# Patient Record
Sex: Female | Born: 1985 | Race: Black or African American | Hispanic: No | Marital: Married | Smoking: Never smoker
Health system: Southern US, Community
[De-identification: ages and names within clinical notes are randomized; demographics above are authoritative.]

## PROBLEM LIST (undated history)

## (undated) DIAGNOSIS — M5126 Other intervertebral disc displacement, lumbar region: Secondary | ICD-10-CM

## (undated) HISTORY — PX: ANKLE SURGERY: SHX546

---

## 2016-02-09 ENCOUNTER — Encounter (HOSPITAL_COMMUNITY): Payer: Self-pay | Admitting: Emergency Medicine

## 2016-02-09 ENCOUNTER — Emergency Department (HOSPITAL_COMMUNITY)
Admission: EM | Admit: 2016-02-09 | Discharge: 2016-02-09 | Disposition: A | Discharge status: Home / Self Care | Payer: No Typology Code available for payment source | Attending: Emergency Medicine | Admitting: Emergency Medicine

## 2016-02-09 DIAGNOSIS — Y939 Activity, unspecified: Secondary | ICD-10-CM | POA: Diagnosis not present

## 2016-02-09 DIAGNOSIS — Y999 Unspecified external cause status: Secondary | ICD-10-CM | POA: Diagnosis not present

## 2016-02-09 DIAGNOSIS — M545 Low back pain: Secondary | ICD-10-CM | POA: Diagnosis not present

## 2016-02-09 DIAGNOSIS — Y92410 Unspecified street and highway as the place of occurrence of the external cause: Secondary | ICD-10-CM | POA: Insufficient documentation

## 2016-02-09 MED ORDER — CYCLOBENZAPRINE HCL 10 MG PO TABS: 10.0000 mg | ORAL_TABLET | Freq: Three times a day (TID) | ORAL | 0 refills | Status: DC | PRN

## 2016-02-09 MED ORDER — IBUPROFEN 800 MG PO TABS: 800.0000 mg | ORAL_TABLET | Freq: Three times a day (TID) | ORAL | 0 refills | Status: AC
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
## 2016-02-09 NOTE — Discharge Instructions (Signed)
Take flexeril for muscle spasms. Take ibuprofen as needed for pain up to 3 times per day. May alternate with tylenol. If you develop loss of bowel or bladder, urinary retention, fevers, severe pain, numbness/tingling return to the ED. Follow up with ortho for imaging. Use heating pad for pain relief. Get plenty of rest.

## 2016-05-18 ENCOUNTER — Emergency Department (HOSPITAL_COMMUNITY): Payer: Self-pay

## 2016-05-18 ENCOUNTER — Encounter (HOSPITAL_COMMUNITY): Payer: Self-pay | Admitting: Emergency Medicine

## 2016-05-18 ENCOUNTER — Emergency Department (HOSPITAL_COMMUNITY)
Admission: EM | Admit: 2016-05-18 | Discharge: 2016-05-18 | Disposition: A | Discharge status: Home / Self Care | Payer: Self-pay | Attending: Emergency Medicine | Admitting: Emergency Medicine

## 2016-05-18 DIAGNOSIS — J069 Acute upper respiratory infection, unspecified: Secondary | ICD-10-CM | POA: Insufficient documentation

## 2016-05-18 MED ORDER — FLUTICASONE PROPIONATE 50 MCG/ACT NA SUSP: 1.0000 | Freq: Every day | NASAL | 0 refills | Status: AC

## 2016-05-18 NOTE — Discharge Instructions (Signed)
Please read and follow all provided instructions.  Your diagnoses today include:  1. Upper respiratory tract infection, unspecified type     You appear to have an upper respiratory infection (URI). An upper respiratory tract infection, or cold, is a viral infection of the air passages leading to the lungs. It should improve gradually after 5-7 days. You may have a lingering cough that lasts for 2- 4 weeks after the infection.  Tests performed today include: Vital signs. See below for your results today.   Medications prescribed:   Take any prescribed medications only as directed. Treatment for your infection is aimed at treating the symptoms. There are no medications, such as antibiotics, that will cure your infection.   Home care instructions:  Follow any educational materials contained in this packet.   Your illness is contagious and can be spread to others, especially during the first 3 or 4 days. It cannot be cured by antibiotics or other medicines. Take basic precautions such as washing your hands often, covering your mouth when you cough or sneeze, and avoiding public places where you could spread your illness to others.   Please continue drinking plenty of fluids.  Use over-the-counter medicines as needed as directed on packaging for symptom relief.  You may also use ibuprofen or tylenol as directed on packaging for pain or fever.  Do not take multiple medicines containing Tylenol or acetaminophen to avoid taking too much of this medication.  Follow-up instructions: Please follow-up with your primary care provider in the next 3 days for further evaluation of your symptoms if you are not feeling better.   Return instructions:  Please return to the Emergency Department if you experience worsening symptoms.  RETURN IMMEDIATELY IF you develop shortness of breath, confusion or altered mental status, a new rash, become dizzy, faint, or poorly responsive, or are unable to be cared for at  home. Please return if you have persistent vomiting and cannot keep down fluids or develop a fever that is not controlled by tylenol or motrin.   Please return if you have any other emergent concerns.  Additional Information:  Your vital signs today were: BP 117/88 (BP Location: Left Arm)    Pulse 74    Temp 98.1 F (36.7 C) (Oral)    Resp 16    Ht 5\' 6"  (1.676 m)    Wt 93.9 kg    LMP 05/09/2016 (Approximate)    SpO2 100%    BMI 33.41 kg/m  If your blood pressure (BP) was elevated above 135/85 this visit, please have this repeated by your doctor within one month. --------------

## 2016-05-18 NOTE — ED Triage Notes (Signed)
Pt. reports persistent productive cough with chest congestion / nasal congestion and runny nose for 3 days , orthopnea when lying flat , denies fever or chills , pain at right axilla with deep inspiration .

## 2016-05-18 NOTE — ED Provider Notes (Signed)
[facility]PT Provider Note   CSN: [id number] Arrival date & time: 05/18/16  [id number]  History   Chief Complaint Chief Complaint  Patient presents with  . Cough    Chest Congestion   . Nasal Congestion    HPI Carolyn Farrell is a 30 y.o. female.  HPI  30 y.o. female presents to the Emergency Department today complaining of persistent cough, chest congestion x 3 days. Notes rhinorrhea as well with sinus congestion. No fever/chills. Used OTC mucinex once with some relief. Notes No CP/ABD pain. Mild SOB. No N/V/D. No sick contacts. No pain currently. No recent travel. No other symptoms noted   History reviewed. No pertinent past medical history.  There are no active problems to display for this patient.   Past Surgical History:  Procedure Laterality Date  . ANKLE SURGERY      OB History    No data available       Home Medications    Prior to Admission medications   Medication Sig Start Date End Date Taking? Authorizing Provider  cyclobenzaprine (FLEXERIL) 10 MG tablet Take 1 tablet (10 mg total) by mouth 3 (three) times daily as needed for muscle spasms. 02/09/16   Rise MuKenneth T Leaphart, PA-C  ibuprofen (ADVIL,MOTRIN) 800 MG tablet Take 1 tablet (800 mg total) by mouth 3 (three) times daily. 02/09/16   Rise MuKenneth T Leaphart, PA-C    Family History No family history on file.  Social History Social History  Substance Use Topics  . Smoking status: Never Smoker  . Smokeless tobacco: Never Used  . Alcohol use Yes     Comment: occ     Allergies   Patient has no known allergies.   Review of Systems Review of Systems  Constitutional: Negative for fever.  HENT: Positive for congestion, rhinorrhea and sinus pressure.   Respiratory: Positive for cough and shortness of breath.   Cardiovascular: Negative for chest pain.  Gastrointestinal: Negative for diarrhea, nausea and vomiting.   Physical Exam Updated Vital Signs BP 117/88 (BP Location: Left Arm)   Pulse 74    Temp 98.1 F (36.7 C) (Oral)   Resp 16   Ht 5\' 6"  (1.676 m)   Wt 93.9 kg   LMP 05/09/2016 (Approximate)   SpO2 100%   BMI 33.41 kg/m   Physical Exam  Constitutional: She is oriented to person, place, and time. She appears well-developed and well-nourished. No distress.  HENT:  Head: Normocephalic and atraumatic.  Right Ear: Tympanic membrane, external ear and ear canal normal.  Left Ear: Tympanic membrane, external ear and ear canal normal.  Nose: Nose normal.  Mouth/Throat: Uvula is midline, oropharynx is clear and moist and mucous membranes are normal. No trismus in the jaw. No oropharyngeal exudate, posterior oropharyngeal erythema or tonsillar abscesses.  Eyes: EOM are normal. Pupils are equal, round, and reactive to light.  Neck: Normal range of motion. Neck supple. No tracheal deviation present.  Cardiovascular: Normal rate, regular rhythm, S1 normal, S2 normal, normal heart sounds, intact distal pulses and normal pulses.   Pulmonary/Chest: Effort normal and breath sounds normal. No respiratory distress. She has no decreased breath sounds. She has no wheezes. She has no rhonchi. She has no rales.  Abdominal: Normal appearance and bowel sounds are normal. There is no tenderness.  Musculoskeletal: Normal range of motion.  Neurological: She is alert and oriented to person, place, and time.  Skin: Skin is warm and dry.  Psychiatric: She has a normal mood and affect. Her speech is  normal and behavior is normal. Thought content normal.     ED Treatments / Results  Labs (all labs ordered are listed, but only abnormal results are displayed) Labs Reviewed - No data to display  EKG  EKG Interpretation None       Radiology Dg Chest 2 View  Result Date: 05/18/2016 CLINICAL DATA:  Productive cough with hemoptysis. Shortness of breath. EXAM: CHEST  2 VIEW COMPARISON:  None. FINDINGS: Lungs are clear. Heart size and pulmonary vascularity are normal. No adenopathy. No bone  lesions. IMPRESSION: No edema or consolidation. Electronically Signed   By: Bretta BangWilliam  Woodruff III M.D.   On: 05/18/2016 08:39    Procedures Procedures (including critical care time)  Medications Ordered in ED Medications - No data to display   Initial Impression / Assessment and Plan / ED Course  I have reviewed the triage vital signs and the nursing notes.  Pertinent labs & imaging results that were available during my care of the patient were reviewed by me and considered in my medical decision making (see chart for details).  Clinical Course    Final Clinical Impressions(s) / ED Diagnoses   {I have reviewed and evaluated the relevant imaging studies.  {I have reviewed the relevant previous healthcare records.  {I obtained HPI from historian.   ED Course:  Assessment: Pt is a 30yF presents with URI symptoms x 3 days. No fever. No N/V/D. No CP/ABD pain. Mild SOB. No recent travel. No recent surgeries. On exam, pt in NAD. VSS. Afebrile. Lungs CTA, Heart RRR. Abdomen nontender/soft. Pt CXR negative for acute infiltrate. Patients symptoms are consistent with URI, likely viral etiology. Discussed that antibiotics are not indicated for viral infections. PERC negative. Pt will be discharged with symptomatic treatment.  Verbalizes understanding and is agreeable with plan. Pt is hemodynamically stable & in NAD prior to dc  Disposition/Plan:  DC Home Additional Verbal discharge instructions given and discussed with patient.  Pt Instructed to f/u with PCP in the next week for evaluation and treatment of symptoms. Return precautions given Pt acknowledges and agrees with plan  Supervising Physician Alvira MondayErin Schlossman, MD  Final diagnoses:  Upper respiratory tract infection, unspecified type    New Prescriptions New Prescriptions   FLUTICASONE (FLONASE) 50 MCG/ACT NASAL SPRAY    Place 1 spray into both nostrils daily.     [name] Deadrian Toya, PA-C 05/18/16 [id number]    Alvira MondayErin Schlossman, MD 05/19/16  [phone]

## 2016-10-02 ENCOUNTER — Encounter (HOSPITAL_COMMUNITY): Payer: Self-pay | Admitting: Emergency Medicine

## 2016-10-02 ENCOUNTER — Emergency Department (HOSPITAL_COMMUNITY)
Admission: EM | Admit: 2016-10-02 | Discharge: 2016-10-02 | Disposition: A | Discharge status: Home / Self Care | Payer: Self-pay | Attending: Emergency Medicine | Admitting: Emergency Medicine

## 2016-10-02 DIAGNOSIS — Z79899 Other long term (current) drug therapy: Secondary | ICD-10-CM | POA: Insufficient documentation

## 2016-10-02 DIAGNOSIS — M5441 Lumbago with sciatica, right side: Secondary | ICD-10-CM

## 2016-10-02 HISTORY — DX: Other intervertebral disc displacement, lumbar region: M51.26

## 2016-10-02 MED ORDER — IBUPROFEN 200 MG PO TABS
600.0000 mg | ORAL_TABLET | Freq: Once | ORAL | Status: AC
  Administered 2016-10-02: 600 mg via ORAL
  Filled 2016-10-02: qty 3

## 2016-10-02 MED ORDER — PREDNISONE 20 MG PO TABS
60.0000 mg | ORAL_TABLET | Freq: Once | ORAL | Status: AC
  Administered 2016-10-02: 60 mg via ORAL
  Filled 2016-10-02: qty 3

## 2016-10-02 MED ORDER — PREDNISONE 10 MG (21) PO TBPK: ORAL_TABLET | Freq: Every day | ORAL | 0 refills | Status: DC

## 2016-10-02 MED ORDER — METHOCARBAMOL 500 MG PO TABS
750.0000 mg | ORAL_TABLET | Freq: Once | ORAL | Status: AC
  Administered 2016-10-02: 750 mg via ORAL
  Filled 2016-10-02: qty 2

## 2016-10-02 MED ORDER — TRAMADOL HCL 50 MG PO TABS: 50.0000 mg | ORAL_TABLET | Freq: Four times a day (QID) | ORAL | 0 refills | Status: AC | PRN

## 2016-10-02 MED ORDER — ACETAMINOPHEN 500 MG PO TABS
1000.0000 mg | ORAL_TABLET | Freq: Once | ORAL | Status: AC
  Administered 2016-10-02: 1000 mg via ORAL
  Filled 2016-10-02: qty 2

## 2016-10-02 MED ORDER — METHOCARBAMOL 500 MG PO TABS: 500.0000 mg | ORAL_TABLET | Freq: Three times a day (TID) | ORAL | 0 refills | Status: AC

## 2016-10-02 NOTE — ED Notes (Signed)
Bed: WTR5 Expected date:  Expected time:  Means of arrival:  Comments: 

## 2016-10-02 NOTE — ED Triage Notes (Signed)
Patient states that yesterday on her drive back from [place] she started having right lower back pain that runs down right leg.  Patient states that she has a herniated disc and sees ortho for but they are closed today.

## 2016-10-02 NOTE — ED Provider Notes (Signed)
[facility]-EMERGENCY DEPT Provider Note   CSN: [id number] Arrival date & time: 10/02/16  4098  By signing my name below, I, Modena Jansky, attest that this documentation has been prepared under the direction and in the presence of non-physician practitioner, Liberty Handy, PA-C. Electronically Signed: Modena Jansky, Scribe. 10/02/2016. 10:27 AM.  History   Chief Complaint Chief Complaint  Patient presents with  . Back Pain   The history is provided by the patient. No language interpreter was used.   HPI Comments: Carolyn Farrell is a 31 y.o. female with a PMHx of lumbar herniated disc who presents to the Emergency Department complaining of constant moderate right lower back pain that started yesterday. She had an MRI done last month ago revealing a bulging lumbar disc (L5). She states her pain began when she was driving home from [place]. She has a prior hx of pain, but not as severe as today. She tried Flexeril yesterday without relief. Her pain is exacerbated by sitting and coughing. She describes the pain as a sharp sensation radiating down her RLE. She reports associated tingling to right posterior thigh. Denies any hx of IV drug use, fever, nausea, vomiting, diarrhea, dysuria, hematuria, difficulty urinating, bowel/bladder incontinence, saddle paresthesias, BLE weakness, or other complaints at this time.  Past Medical History:  Diagnosis Date  . Lumbar herniated disc     There are no active problems to display for this patient.   Past Surgical History:  Procedure Laterality Date  . ANKLE SURGERY      OB History    No data available       Home Medications    Prior to Admission medications   Medication Sig Start Date End Date Taking? Authorizing Provider  cyclobenzaprine (FLEXERIL) 10 MG tablet Take 1 tablet (10 mg total) by mouth 3 (three) times daily as needed for muscle spasms. 02/09/16   Rise Mu, PA-C  fluticasone (FLONASE) 50 MCG/ACT nasal spray Place 1 spray  into both nostrils daily. 05/18/16   Audry Pili, PA-C  ibuprofen (ADVIL,MOTRIN) 800 MG tablet Take 1 tablet (800 mg total) by mouth 3 (three) times daily. 02/09/16   Rise Mu, PA-C  methocarbamol (ROBAXIN) 500 MG tablet Take 1 tablet (500 mg total) by mouth 3 (three) times daily. 10/02/16 10/07/16  Liberty Handy, PA-C  predniSONE (STERAPRED UNI-PAK 21 TAB) 10 MG (21) TBPK tablet Take by mouth daily. Take 6 tabs by mouth daily  for 2 days, then 5 tabs for 2 days, then 4 tabs for 2 days, then 3 tabs for 2 days, 2 tabs for 2 days, then 1 tab by mouth daily for 2 days 10/02/16   Liberty Handy, PA-C  traMADol (ULTRAM) 50 MG tablet Take 1 tablet (50 mg total) by mouth every 6 (six) hours as needed. 10/02/16   Liberty Handy, PA-C    Family History No family history on file.  Social History Social History  Substance Use Topics  . Smoking status: Never Smoker  . Smokeless tobacco: Never Used  . Alcohol use Yes     Comment: occ     Allergies   Patient has no known allergies.   Review of Systems Review of Systems  Constitutional: Negative for fever.  Gastrointestinal: Negative for abdominal pain, diarrhea, nausea and vomiting.  Genitourinary: Negative for difficulty urinating, dysuria and hematuria.  Musculoskeletal: Positive for back pain.  Neurological: Negative for weakness.     Physical Exam Updated Vital Signs BP 130/84 (BP Location: Left Arm)  Pulse 93   Temp 98.3 F (36.8 C) (Oral)   Resp 18   Ht  (1.676 m)   Wt 195 lb (88.5 kg)   LMP 09/25/2016   SpO2 99%   BMI 31.47 kg/m   Physical Exam  Constitutional: She is oriented to person, place, and time. She appears well-developed and well-nourished. She is cooperative. No distress.  HENT:  Head: Normocephalic and atraumatic.  Nose: Nose normal.  Eyes: Conjunctivae and EOM are normal. No scleral icterus.  Cardiovascular: Normal rate, regular rhythm, normal heart sounds and intact distal pulses.   No  murmur heard. Pulmonary/Chest: Effort normal and breath sounds normal. She has no wheezes.  Musculoskeletal: Normal range of motion. She exhibits tenderness. She exhibits no deformity.  +Tenderness to right sciatic notch and SI joint.  +Pain is worsened with right hip flexion, internal rotation, and external rotation.  +Positive straight leg raise on the right.  +Slow, antalgic gait favoring right side Full active ROM of CTL spine including flexion, extension, lateral bend and rotation. +Pain worse with lumbar spine flexion  No midline CTL spine tenderness.  Full passive hip, knee and ankle ROM bilaterally.   Neurological: She is alert and oriented to person, place, and time.  5/5 strength with hip flexion and extension, bilaterally.  5/5 strength with knee flexion and extension, bilaterally.  5/5 strength with ankle dorsiflexion and plantar flexion, bilaterally.  Sensation to light touch intact in the distribution of the obturator nerve, lateral cutaneous nerve, femoral nerve, common fibular nerve.  2/4 knee DTR bilaterally.    Foot: sensation to light touch intact in the distribution of the saphenous nerve, medial plantar nerve, lateral plantar nerve, bilaterally.   Skin: Skin is warm and dry. Capillary refill takes less than 2 seconds.  Psychiatric: She has a normal mood and affect. Her behavior is normal. Judgment and thought content normal.  Nursing note and vitals reviewed.    ED Treatments / Results  DIAGNOSTIC STUDIES: Oxygen Saturation is 99% on RA, normal by my interpretation.    COORDINATION OF CARE: 10:31 AM- Pt advised of plan for treatment and pt agrees.  Labs (all labs ordered are listed, but only abnormal results are displayed) Labs Reviewed - No data to display  EKG  EKG Interpretation None       Radiology No results found.  Procedures Procedures (including critical care time)  Medications Ordered in ED Medications  ibuprofen (ADVIL,MOTRIN) tablet  600 mg (600 mg Oral Given 10/02/16 1113)  acetaminophen (TYLENOL) tablet 1,000 mg (1,000 mg Oral Given 10/02/16 1113)  methocarbamol (ROBAXIN) tablet 750 mg (750 mg Oral Given 10/02/16 1116)  predniSONE (DELTASONE) tablet 60 mg (60 mg Oral Given 10/02/16 1114)     Initial Impression / Assessment and Plan / ED Course  I have reviewed the triage vital signs and the nursing notes.  Pertinent labs & imaging results that were available during my care of the patient were reviewed by me and considered in my medical decision making (see chart for details).     Patient is a 31 y.o. female with pertinent pmh of L5 disc protrusion diagnosed by MRI done last month (followed by [facility]) presents with right LBP with radiation to posterior right thigh that started yesterday after car ride from [place].  On exam VS are within normal limits.  Patient can walk but states it aggravates back pain.  There is tenderness at R SI joint and R sciatic notch.  LBP radiating to posterior right  thigh is consistently reproducible with +SLR.  No groin numbness.  Normal lower extremity neurological exam.    Initial differential diagnoses include muscular strain or spasms or exacerbation or known L5 disc protrusion.  Low suspicion for vertebral compression fx, pyelonephritis, nephrolithiasis, epidural abscess, AAA, dissection, cauda equina.  No red flag symptoms of back pain including: fecal incontinence, urinary retention or overflow incontinence, night sweats, waking from sleep with back pain, unexplained fevers or weight loss, h/o cancer, IVDU, recent trauma.  ED labs and imaging not indicated today as abdominal and MSK exam reassuring and no red flag symptoms present. I do not suspect bony or intraabdominal/pelvic emergency at this time. Will treat radiculopathy with steroids, short course of narcotic pain medications, robaxin, NSAIDs, rest, ice, and ortho f/u for further outpatient interventions. ED return precautions  discussed with pt who verbalized understanding and was agreeable to dispo plan. Patient has follow up appointment with orthopedist already in 4 days.   Final Clinical Impressions(s) / ED Diagnoses   Final diagnoses:  Acute right-sided low back pain with right-sided sciatica    New Prescriptions Discharge Medication List as of 10/02/2016 10:57 AM    START taking these medications   Details  methocarbamol (ROBAXIN) 500 MG tablet Take 1 tablet (500 mg total) by mouth 3 (three) times daily., Starting Sun 10/02/2016, Until Fri 10/07/2016, Print    predniSONE (STERAPRED UNI-PAK 21 TAB) 10 MG (21) TBPK tablet Take by mouth daily. Take 6 tabs by mouth daily  for 2 days, then 5 tabs for 2 days, then 4 tabs for 2 days, then 3 tabs for 2 days, 2 tabs for 2 days, then 1 tab by mouth daily for 2 days, Starting Sun 10/02/2016, Print    traMADol (ULTRAM) 50 MG tablet Take 1 tablet (50 mg total) by mouth every 6 (six) hours as needed., Starting Sun 10/02/2016, Print       I personally performed the services described in this documentation, which was scribed in my presence. The recorded information has been reviewed and is accurate.     Liberty Handy, PA-C 10/02/16 1258    Tilden Fossa, MD 10/03/16 [phone]

## 2016-10-02 NOTE — Discharge Instructions (Signed)
Your symptoms today are most likely due to a flare of your disc protrusion of your lumbar spine affecting or inflaming your sciatic nerve.  Please take the following medications as prescribed for the next 3-5 days or until you see your orthopedist: Prednisone for inflammation Robaxin for muscle spasms  Tramadol for severe pain  Ibuprofen 600 mg + Tylenol 1000 mg every 8 hours  Follow up with your orthopedist appointment in 4 days for a re-evaluation.  Your orthopedist may suggest physical therapy or other additional treatment modalities  Avoid any exertional activities that worsen your pain.  Return to the emergency department if you notice lower extremity weakness, groin numbness, bowel or bladder incontinence, fevers or if your symptoms worsen.

## 2017-06-06 ENCOUNTER — Telehealth (HOSPITAL_COMMUNITY): Payer: Self-pay | Admitting: Lactation Services

## 2018-03-07 ENCOUNTER — Emergency Department (HOSPITAL_COMMUNITY)
Admission: EM | Admit: 2018-03-07 | Discharge: 2018-03-07 | Disposition: A | Discharge status: Home / Self Care | Payer: Self-pay | Attending: Emergency Medicine | Admitting: Emergency Medicine

## 2018-03-07 ENCOUNTER — Encounter (HOSPITAL_COMMUNITY): Payer: Self-pay | Admitting: *Deleted

## 2018-03-07 DIAGNOSIS — M545 Low back pain, unspecified: Secondary | ICD-10-CM

## 2018-03-07 DIAGNOSIS — Z79899 Other long term (current) drug therapy: Secondary | ICD-10-CM | POA: Insufficient documentation

## 2018-03-07 DIAGNOSIS — G8929 Other chronic pain: Secondary | ICD-10-CM | POA: Insufficient documentation

## 2018-03-07 MED ORDER — METHOCARBAMOL 1000 MG/10ML IJ SOLN: 500.0000 mg | Freq: Once | INTRAMUSCULAR | Status: DC

## 2018-03-07 MED ORDER — CYCLOBENZAPRINE HCL 10 MG PO TABS: 10.0000 mg | ORAL_TABLET | Freq: Two times a day (BID) | ORAL | 0 refills | Status: DC | PRN

## 2018-03-07 MED ORDER — FENTANYL CITRATE (PF) 100 MCG/2ML IJ SOLN
50.0000 ug | Freq: Once | INTRAMUSCULAR | Status: AC
  Administered 2018-03-07: 50 ug via INTRAMUSCULAR
  Filled 2018-03-07: qty 2

## 2018-03-07 MED ORDER — PREDNISONE 20 MG PO TABS
60.0000 mg | ORAL_TABLET | Freq: Once | ORAL | Status: AC
  Administered 2018-03-07: 60 mg via ORAL
  Filled 2018-03-07: qty 3

## 2018-03-07 MED ORDER — NITROGLYCERIN IN D5W 200-5 MCG/ML-% IV SOLN: 0.0000 ug/min | INTRAVENOUS | Status: DC

## 2018-03-07 MED ORDER — ONDANSETRON 4 MG PO TBDP
4.0000 mg | ORAL_TABLET | Freq: Once | ORAL | Status: AC
  Administered 2018-03-07: 4 mg via ORAL
  Filled 2018-03-07: qty 1

## 2018-03-07 MED ORDER — OXYCODONE-ACETAMINOPHEN 5-325 MG PO TABS
1.0000 | ORAL_TABLET | ORAL | Status: DC | PRN
  Administered 2018-03-07: 1 via ORAL
  Filled 2018-03-07: qty 1

## 2018-03-07 MED ORDER — HYDROCODONE-ACETAMINOPHEN 5-325 MG PO TABS: 1.0000 | ORAL_TABLET | Freq: Four times a day (QID) | ORAL | 0 refills | Status: AC | PRN

## 2018-03-07 MED ORDER — METHOCARBAMOL 500 MG PO TABS
500.0000 mg | ORAL_TABLET | Freq: Once | ORAL | Status: AC
  Administered 2018-03-07: 500 mg via ORAL
  Filled 2018-03-07: qty 1

## 2018-03-07 NOTE — ED Triage Notes (Signed)
Pt in via EMS to triage, was taking a shower and developed a sharp pain to her right lower back that radiates down her right leg, pain is intermittent and worse with movement, history of herniated disk

## 2018-03-07 NOTE — ED Provider Notes (Signed)
Patient placed in Quick Look pathway, seen and evaluated   Chief Complaint: Low back pain  HPI:  Showering earlier today and got sudden onset lower back pain radiating down her right leg.  No fall or injury.  She reports pain is 10/10 in severity and feels like a spasm.  Worsened with palpation or with right leg movement.  No medications prior to arrival.  ROS: No numbness or weakness in her legs.  No loss of bowel or bladder control.  Physical Exam:   Gen: No distress  Neuro: Awake and Alert  Skin: Warm    Focused Exam: Exquisitely tender to palpation of the right paraspinal muscles of the lumbar spine.  No midline T-spine or L-spine tenderness.  Sensation to light touch intact in bilateral lower extremities.   Initiation of care has begun. The patient has been counseled on the process, plan, and necessity for staying for the completion/evaluation, and the remainder of the medical screening examination    Farrell MarseillesShrosbree, Carolyn Morace J, PA-C 03/07/18 1908    Mesner, Barbara CowerJason, MD 03/08/18 [phone]

## 2018-03-07 NOTE — ED Notes (Signed)
Pt vomited immediately after receiving percocet

## 2018-03-07 NOTE — Discharge Instructions (Addendum)
Please read instructions below.  You can take hydrocodone every 6 hours for severe pain. You can take 600mg  of ibuprofen every 6 hours as needed for pain.  Be aware this medication can make you drowsy; do not take while driving or drinking alcohol.   Apply ice to your back for 20 minutes at a time.  You can also apply heat if this provides more relief.   You can take Flexeril/cyclobenzaprine every 12 hours as needed for muscle spasm.  Be aware this medication can make you drowsy; do not take while driving or drinking alcohol.   Follow-up with your primary care provider symptoms persist.   Return to ER if new numbness or tingling in your arms or legs, inability to urinate, inability to hold your bowels, or weakness in your extremities.

## 2018-03-07 NOTE — ED Provider Notes (Signed)
[facility] EMERGENCY DEPARTMENT Provider Note   CSN: [id number] Arrival date & time: 03/07/18  1832     History   Chief Complaint Chief Complaint  Patient presents with  . Back Pain    HPI Carolyn Farrell is a 32 y.o. female with past medical history of herniated lumbar disc, presenting to the ED with acute onset of right lower back pain.  She states she was in the shower and must have turned wrong and had instant pain to the right lower back radiating down into her right leg.  Pain is similar to her pain associated with herniated disc, however states this is the most pain she is experienced.  Denies particular injury or fall.  No medications tried prior to arrival.  Pain began around 5 PM this evening.  She is followed by [facility] for her back pain.  Denies weakness to extremities, bowel or bladder incontinence, saddle paresthesia, fever, abdominal pain, history of cancer or IV drug use.  She was given Percocet in triage though states she vomited up secondary to pain.  States she is not nauseous.  The history is provided by the patient.    Past Medical History:  Diagnosis Date  . Lumbar herniated disc     There are no active problems to display for this patient.   Past Surgical History:  Procedure Laterality Date  . ANKLE SURGERY       OB History   None      Home Medications    Prior to Admission medications   Medication Sig Start Date End Date Taking? Authorizing Provider  cyclobenzaprine (FLEXERIL) 10 MG tablet Take 1 tablet (10 mg total) by mouth 2 (two) times daily as needed for muscle spasms. 03/07/18   Robinson, [place] N, PA-C  fluticasone (FLONASE) 50 MCG/ACT nasal spray Place 1 spray into both nostrils daily. 05/18/16   Audry Pili, PA-C  HYDROcodone-acetaminophen (NORCO/VICODIN) 5-325 MG tablet Take 1 tablet by mouth every 6 (six) hours as needed for severe pain. 03/07/18   Robinson, [place] N, PA-C  ibuprofen (ADVIL,MOTRIN) 800 MG  tablet Take 1 tablet (800 mg total) by mouth 3 (three) times daily. 02/09/16   Rise Mu, PA-C  predniSONE (STERAPRED UNI-PAK 21 TAB) 10 MG (21) TBPK tablet Take by mouth daily. Take 6 tabs by mouth daily  for 2 days, then 5 tabs for 2 days, then 4 tabs for 2 days, then 3 tabs for 2 days, 2 tabs for 2 days, then 1 tab by mouth daily for 2 days 10/02/16   Liberty Handy, PA-C  traMADol (ULTRAM) 50 MG tablet Take 1 tablet (50 mg total) by mouth every 6 (six) hours as needed. 10/02/16   Liberty Handy, PA-C    Family History History reviewed. No pertinent family history.  Social History Social History   Tobacco Use  . Smoking status: Never Smoker  . Smokeless tobacco: Never Used  Substance Use Topics  . Alcohol use: Yes    Comment: occ  . Drug use: No     Allergies   Patient has no known allergies.   Review of Systems Review of Systems  Constitutional: Negative for fever.  Gastrointestinal: Negative for abdominal pain.       No bowel incontinence  Genitourinary: Negative for difficulty urinating.  Musculoskeletal: Positive for back pain.  Neurological: Negative for weakness and numbness.  All other systems reviewed and are negative.    Physical Exam Updated Vital Signs BP 121/82 (BP  Location: Left Arm)   Pulse 87   Temp 98.7 F (37.1 C) (Oral)   Resp 20   Ht 5\' 6"  (1.676 m)   Wt 88.5 kg   SpO2 98%   BMI 31.47 kg/m   Physical Exam  Constitutional: She appears well-developed and well-nourished.  HENT:  Head: Normocephalic and atraumatic.  Eyes: Conjunctivae are normal.  Cardiovascular: Normal rate, regular rhythm and intact distal pulses.  Pulmonary/Chest: Effort normal and breath sounds normal.  Abdominal: Soft. Bowel sounds are normal. She exhibits no distension. There is no tenderness.  Musculoskeletal:  No midline spinal tenderness, no bony step-offs or gross deformities.  There is right sided paraspinal tenderness to the lumbar spine with  extension into the gluteal region.  Neurological:  Motor:  Normal tone. 5/5 in lower extremities bilaterally including strong and equal dorsiflexion/plantar flexion Sensory: Pinprick and light touch normal in BLE extremities.  Deep Tendon Reflexes: 2+ and symmetric in the b/l patella Gait: Unable to assess gait secondary to pain. CV: distal pulses palpable throughout    Psychiatric: She has a normal mood and affect. Her behavior is normal.  Nursing note and vitals reviewed.    ED Treatments / Results  Labs (all labs ordered are listed, but only abnormal results are displayed) Labs Reviewed - No data to display  EKG None  Radiology No results found.  Procedures Procedures (including critical care time)  Medications Ordered in ED Medications  oxyCODONE-acetaminophen (PERCOCET/ROXICET) 5-325 MG per tablet 1 tablet (1 tablet Oral Given 03/07/18 1853)  predniSONE (DELTASONE) tablet 60 mg (60 mg Oral Given 03/07/18 2134)  ondansetron (ZOFRAN-ODT) disintegrating tablet 4 mg (4 mg Oral Given 03/07/18 2058)  fentaNYL (SUBLIMAZE) injection 50 mcg (50 mcg Intramuscular Given 03/07/18 2100)  methocarbamol (ROBAXIN) tablet 500 mg (500 mg Oral Given 03/07/18 2134)     Initial Impression / Assessment and Plan / ED Course  I have reviewed the triage vital signs and the nursing notes.  Pertinent labs & imaging results that were available during my care of the patient were reviewed by me and considered in my medical decision making (see chart for details).     Patient with back pain, history of herniated disc.  No recent injury.  No neurological deficits and normal neuro exam.   Patient can walk but states is painful.  No loss of bowel or bladder control.  No concern for cauda equina.  No fever, night sweats, weight loss, h/o cancer, IVDU.  Pain treated in the ED with improvement.  Discussed symptomatic management, and follow-up with her orthopedic specialist.  Safe for discharge.  [place] Controlled Substance reporting System queried  Discussed results, findings, treatment and follow up. Patient advised of return precautions. Patient verbalized understanding and agreed with plan.  Final Clinical Impressions(s) / ED Diagnoses   Final diagnoses:  Acute exacerbation of chronic low back pain    ED Discharge Orders         Ordered    HYDROcodone-acetaminophen (NORCO/VICODIN) 5-325 MG tablet  Every 6 hours PRN     03/07/18 2228    cyclobenzaprine (FLEXERIL) 10 MG tablet  2 times daily PRN     03/07/18 2228           Robinson, [place] N, PA-C 03/07/18 2230    Benjiman CorePickering, Nathan, MD 03/07/18 2230

## 2018-03-07 NOTE — ED Notes (Signed)
Pts family reporting that pt was given pain medications out front and immediately vomited after.  Family states they said they were going to order something else but it wasn't administered.

## 2018-03-07 NOTE — ED Notes (Signed)
Discharge instructions and prescriptions discussed with Pt. Pt verbalized understanding. Pt stable and leaving via WC. 

## 2019-02-11 ENCOUNTER — Emergency Department (HOSPITAL_COMMUNITY): Payer: Self-pay

## 2019-02-11 ENCOUNTER — Emergency Department (HOSPITAL_COMMUNITY)
Admission: EM | Admit: 2019-02-11 | Discharge: 2019-02-11 | Disposition: A | Discharge status: Home / Self Care | Payer: Self-pay | Attending: Emergency Medicine | Admitting: Emergency Medicine

## 2019-02-11 ENCOUNTER — Other Ambulatory Visit: Payer: Self-pay

## 2019-02-11 DIAGNOSIS — Z20828 Contact with and (suspected) exposure to other viral communicable diseases: Secondary | ICD-10-CM | POA: Insufficient documentation

## 2019-02-11 DIAGNOSIS — R05 Cough: Secondary | ICD-10-CM | POA: Insufficient documentation

## 2019-02-11 DIAGNOSIS — M940 Chondrocostal junction syndrome [Tietze]: Secondary | ICD-10-CM | POA: Insufficient documentation

## 2019-02-11 DIAGNOSIS — R059 Cough, unspecified: Secondary | ICD-10-CM

## 2019-02-11 DIAGNOSIS — Z79899 Other long term (current) drug therapy: Secondary | ICD-10-CM | POA: Insufficient documentation

## 2019-02-11 LAB — I-STAT BETA HCG BLOOD, ED (MC, WL, AP ONLY): I-stat hCG, quantitative: <5 m[IU]/mL (ref <5)

## 2019-02-11 LAB — CBC
HCT: 41.8 % (ref 36.0–46.0)
Hemoglobin: 13.6 g/dL (ref 12.0–15.0)
MCH: 29.4 pg (ref 26.0–34.0)
MCHC: 32.5 g/dL (ref 30.0–36.0)
MCV: 90.3 fL (ref 80.0–100.0)
Platelets: 308 10*3/uL (ref 150–400)
RBC: 4.63 MIL/uL (ref 3.87–5.11)
RDW: 12.7 % (ref 11.5–15.5)
WBC: 6.2 10*3/uL (ref 4.0–10.5)
nRBC: 0 % (ref 0.0–0.2)

## 2019-02-11 LAB — BASIC METABOLIC PANEL
Anion gap: 10 (ref 5–15)
BUN: 14 mg/dL (ref 6–20)
CO2: 22 mmol/L (ref 22–32)
Calcium: 9.4 mg/dL (ref 8.9–10.3)
Chloride: 107 mmol/L (ref 98–111)
Creatinine, Ser: 0.92 mg/dL (ref 0.44–1.00)
GFR calc Af Amer: >60 mL/min (ref >60)
GFR calc non Af Amer: >60 mL/min (ref >60)
Glucose, Bld: 99 mg/dL (ref 70–99)
Potassium: 3.8 mmol/L (ref 3.5–5.1)
Sodium: 139 mmol/L (ref 135–145)

## 2019-02-11 LAB — TROPONIN I (HIGH SENSITIVITY): Troponin I (High Sensitivity): <2 ng/L (ref <18)

## 2019-02-11 LAB — SARS CORONAVIRUS 2 BY RT PCR (HOSPITAL ORDER, PERFORMED IN ~~LOC~~ HOSPITAL LAB): SARS Coronavirus 2: NEGATIVE

## 2019-02-11 MED ORDER — IBUPROFEN 200 MG PO TABS
600.0000 mg | ORAL_TABLET | Freq: Once | ORAL | Status: AC
  Administered 2019-02-11: 13:00:00 600 mg via ORAL
  Filled 2019-02-11: qty 3

## 2019-02-11 NOTE — ED Triage Notes (Signed)
Pt c/o chest pain and shob and chills since Saturday. Reports Saturday had cough but has subsided.

## 2019-02-11 NOTE — Discharge Instructions (Signed)
Please return for any problem.  Follow-up with your regular care provider as instructed. °

## 2019-02-11 NOTE — ED Provider Notes (Signed)
[facility]-EMERGENCY DEPT Provider Note   CSN: [id number] Arrival date & time: 02/11/19  1139     History   Chief Complaint Chief Complaint  Patient presents with  . Shortness of Breath  . Chest Pain  . Chills    HPI Carolyn Farrell is a 33 y.o. female.     33 year old female with prior medical history as detailed below presents for evaluation of left-sided chest discomfort.  Patient reports sharp anterior left-sided chest discomfort.  This is been ongoing for the last 2 to 3 days.  She reports that this is associated with a mild intermittent cough.  She also reports some mild chills at home.  No documented fever at home.    She reports that her mother may have tested positive for COVID.   No history of prior DVT/PE. No history of CAD.    The history is provided by the patient and medical records.  Chest Pain Pain location:  L chest Pain quality: sharp   Pain radiates to:  Upper back Pain severity:  Mild Onset quality:  Gradual Duration:  3 days Timing:  Constant Progression:  Waxing and waning Chronicity:  New Relieved by:  Nothing Worsened by:  Nothing   Past Medical History:  Diagnosis Date  . Lumbar herniated disc     There are no active problems to display for this patient.   Past Surgical History:  Procedure Laterality Date  . ANKLE SURGERY       OB History   No obstetric history on file.      Home Medications    Prior to Admission medications   Medication Sig Start Date End Date Taking? Authorizing Provider  cyclobenzaprine (FLEXERIL) 10 MG tablet Take 1 tablet (10 mg total) by mouth 2 (two) times daily as needed for muscle spasms. 03/07/18   Robinson, [place] N, PA-C  fluticasone (FLONASE) 50 MCG/ACT nasal spray Place 1 spray into both nostrils daily. 05/18/16   Audry PiliMohr, Tyler, PA-C  HYDROcodone-acetaminophen (NORCO/VICODIN) 5-325 MG tablet Take 1 tablet by mouth every 6 (six) hours as needed for severe pain. 03/07/18    Robinson, [place] N, PA-C  ibuprofen (ADVIL,MOTRIN) 800 MG tablet Take 1 tablet (800 mg total) by mouth 3 (three) times daily. 02/09/16   Rise MuLeaphart, Kenneth T, PA-C  predniSONE (STERAPRED UNI-PAK 21 TAB) 10 MG (21) TBPK tablet Take by mouth daily. Take 6 tabs by mouth daily  for 2 days, then 5 tabs for 2 days, then 4 tabs for 2 days, then 3 tabs for 2 days, 2 tabs for 2 days, then 1 tab by mouth daily for 2 days 10/02/16   Liberty HandyGibbons, Claudia J, PA-C  traMADol (ULTRAM) 50 MG tablet Take 1 tablet (50 mg total) by mouth every 6 (six) hours as needed. 10/02/16   Liberty HandyGibbons, Claudia J, PA-C    Family History No family history on file.  Social History Social History   Tobacco Use  . Smoking status: Never Smoker  . Smokeless tobacco: Never Used  Substance Use Topics  . Alcohol use: Yes    Comment: occ  . Drug use: No     Allergies   Patient has no known allergies.   Review of Systems Review of Systems  Cardiovascular: Positive for chest pain.  All other systems reviewed and are negative.    Physical Exam Updated Vital Signs BP 126/85   Pulse 75   Temp 98.6 F (37 C)   Resp 18   LMP 01/21/2019  SpO2 99%   Physical Exam Vitals signs and nursing note reviewed.  Constitutional:      General: She is not in acute distress.    Appearance: She is well-developed.  HENT:     Head: Normocephalic and atraumatic.  Eyes:     Conjunctiva/sclera: Conjunctivae normal.     Pupils: Pupils are equal, round, and reactive to light.  Neck:     Musculoskeletal: Normal range of motion and neck supple.  Cardiovascular:     Rate and Rhythm: Normal rate and regular rhythm.     Heart sounds: Normal heart sounds.  Pulmonary:     Effort: Pulmonary effort is normal. No respiratory distress.     Breath sounds: Normal breath sounds.  Abdominal:     General: There is no distension.     Palpations: Abdomen is soft.     Tenderness: There is no abdominal tenderness.  Musculoskeletal: Normal range of  motion.        General: No deformity.  Skin:    General: Skin is warm and dry.  Neurological:     General: No focal deficit present.     Mental Status: She is alert and oriented to person, place, and time.      ED Treatments / Results  Labs (all labs ordered are listed, but only abnormal results are displayed) Labs Reviewed  SARS CORONAVIRUS 2 ([facility])  BASIC METABOLIC PANEL  CBC  I-STAT BETA HCG BLOOD, ED ([facility], [facility], [facility] ONLY)  TROPONIN I ([facility])    EKG EKG Interpretation  Date/Time:  Monday February 11 2019 11:51:12 EDT Ventricular Rate:  80 PR Interval:    QRS Duration: 88 QT Interval:  378 QTC Calculation: 436 R Axis:   83 Text Interpretation:  Sinus rhythm Borderline repolarization abnormality Confirmed by Dene Gentry [phone]) on 02/11/2019 12:13:58 PM   Radiology Dg Chest Port 1 View  Result Date: 02/11/2019 CLINICAL DATA:  Chest pain EXAM: PORTABLE CHEST 1 VIEW COMPARISON:  05/18/2016 FINDINGS: The heart size and mediastinal contours are within normal limits. Both lungs are clear. The visualized skeletal structures are unremarkable. IMPRESSION: No active disease. Electronically Signed   By: Jerilynn Mages.  Shick M.D.   On: 02/11/2019 13:36    Procedures Procedures (including critical care time)  Medications Ordered in ED Medications  ibuprofen (ADVIL) tablet 600 mg (has no administration in time range)     Initial Impression / Assessment and Plan / ED Course  I have reviewed the triage vital signs and the nursing notes.  Pertinent labs & imaging results that were available during my care of the patient were reviewed by me and considered in my medical decision making (see chart for details).        MDM  Screen complete  Carolyn Farrell was evaluated in Emergency Department on 02/11/2019 for the symptoms described in the history of present illness. She was evaluated in the context of the global COVID-19  pandemic, which necessitated consideration that the patient might be at risk for infection with the SARS-CoV-2 virus that causes COVID-19. Institutional protocols and algorithms that pertain to the evaluation of patients at risk for COVID-19 are in a state of rapid change based on information released by regulatory bodies including the CDC and federal and state organizations. These policies and algorithms were followed during the patient's care in the ED.   Patient is presenting for evaluation of cough, chills, and atypical chest discomfort.  Patient described chest discomfort is  most likely costochondritis.  Screening labs obtained are without evidence of significant other pathology.  Patient did report a known contact and was positive for COVID. At time of discharge, patient's covid test is pending.    Patient is appropriate for discharge.  She does understand need for close follow-up.  Strict return precautions given and understood.   Final Clinical Impressions(s) / ED Diagnoses   Final diagnoses:  Cough  Costochondritis    ED Discharge Orders    None       Wynetta FinesMessick,  C, MD 02/11/19 1413

## 2019-02-11 NOTE — ED Notes (Signed)
Pt informed this RN that her mother just called and said that she was COVID positive.

## 2020-02-06 IMAGING — DX PORTABLE CHEST - 1 VIEW
1 series · 1 of 1 positions shown · non-contrast
Comparison: 05/18/2016

CLINICAL DATA: Chest pain

EXAM:
PORTABLE CHEST 1 VIEW

[chest ap]
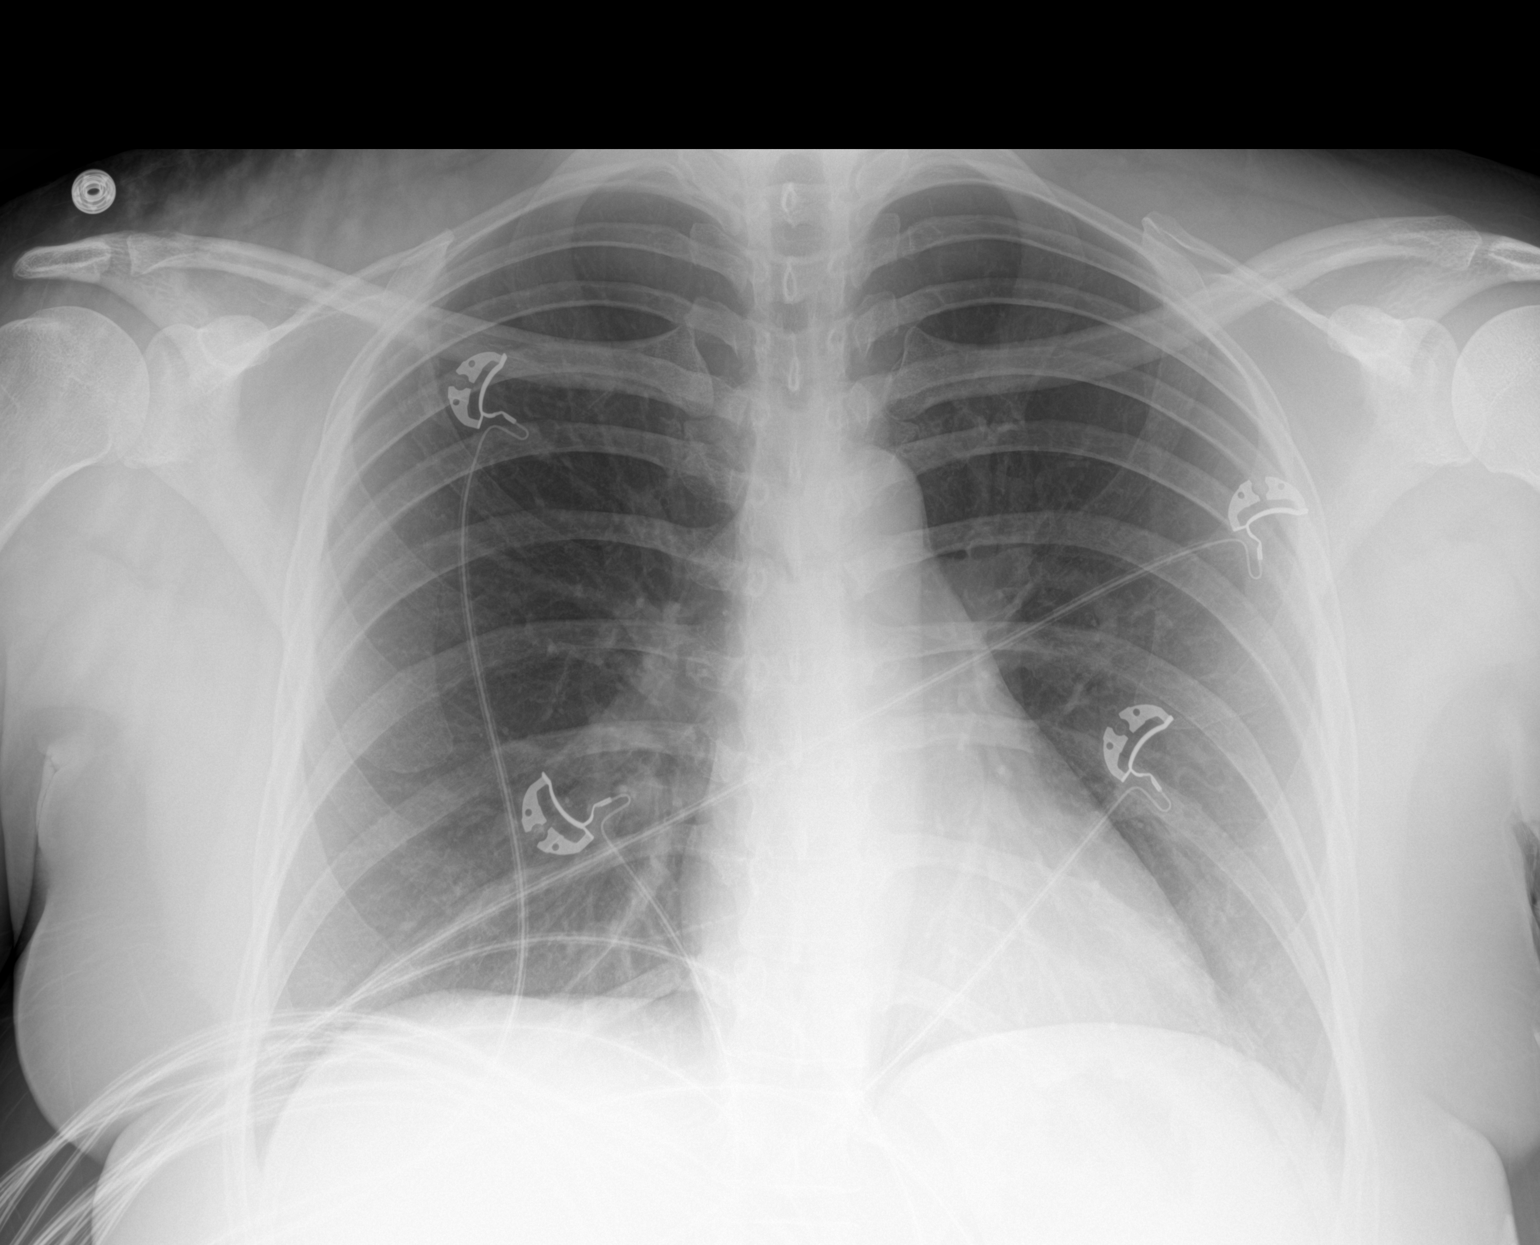

[1 of 1 positions shown; findings below may reference images not displayed]

FINDINGS: The heart size and mediastinal contours are within normal limits.
Both lungs are clear. The visualized skeletal structures are
unremarkable.
IMPRESSION: No active disease.

## 2020-08-02 ENCOUNTER — Encounter (HOSPITAL_COMMUNITY): Payer: Self-pay | Admitting: Emergency Medicine

## 2020-08-02 ENCOUNTER — Other Ambulatory Visit: Payer: Self-pay

## 2020-08-02 ENCOUNTER — Emergency Department (HOSPITAL_COMMUNITY)
Admission: EM | Admit: 2020-08-02 | Discharge: 2020-08-02 | Disposition: A | Discharge status: Home / Self Care | Payer: No Typology Code available for payment source | Attending: Emergency Medicine | Admitting: Emergency Medicine

## 2020-08-02 DIAGNOSIS — S161XXA Strain of muscle, fascia and tendon at neck level, initial encounter: Secondary | ICD-10-CM | POA: Insufficient documentation

## 2020-08-02 DIAGNOSIS — X501XXA Overexertion from prolonged static or awkward postures, initial encounter: Secondary | ICD-10-CM | POA: Insufficient documentation

## 2020-08-02 DIAGNOSIS — S199XXA Unspecified injury of neck, initial encounter: Secondary | ICD-10-CM | POA: Diagnosis present

## 2020-08-02 MED ORDER — KETOROLAC TROMETHAMINE 15 MG/ML IJ SOLN
15.0000 mg | Freq: Once | INTRAMUSCULAR | Status: AC
  Administered 2020-08-02: 15 mg via INTRAMUSCULAR
  Filled 2020-08-02: qty 1

## 2020-08-02 MED ORDER — CYCLOBENZAPRINE HCL 10 MG PO TABS: 10.0000 mg | ORAL_TABLET | Freq: Three times a day (TID) | ORAL | 0 refills | Status: AC | PRN

## 2020-08-02 MED ORDER — DIAZEPAM 5 MG PO TABS
5.0000 mg | ORAL_TABLET | Freq: Once | ORAL | Status: AC
  Administered 2020-08-02: 5 mg via ORAL
  Filled 2020-08-02: qty 1

## 2020-08-02 MED ORDER — OXYCODONE HCL 5 MG PO TABS
5.0000 mg | ORAL_TABLET | Freq: Once | ORAL | Status: AC
  Administered 2020-08-02: 5 mg via ORAL
  Filled 2020-08-02: qty 1

## 2020-08-02 MED ORDER — PREDNISONE 20 MG PO TABS: 40.0000 mg | ORAL_TABLET | Freq: Every day | ORAL | 0 refills | Status: AC

## 2020-08-02 MED ORDER — ACETAMINOPHEN 500 MG PO TABS
1000.0000 mg | ORAL_TABLET | Freq: Once | ORAL | Status: AC
  Administered 2020-08-02: 1000 mg via ORAL
  Filled 2020-08-02: qty 2

## 2020-08-02 NOTE — Discharge Instructions (Signed)
I have prescribed muscle relaxers for your pain, please do not drink or drive while taking this medications as they can make you drowsy.    I have also prescribed steroids, be advised this medication can cause insomnia, appetite changes.    Please  follow-up with PCP in 1 week for reevaluation of your symptoms.  If you experience any changes in your vision, fever, decrease in strength of your upper or lower extremities please return to the emergency department.

## 2020-08-02 NOTE — ED Notes (Signed)
Patient resting on stretcher and holding the right side of her neck. Took flexeril at 7am. Complaining of spasms that are severe since she felt the pop this morning. Pillow provided as requested.

## 2020-08-02 NOTE — ED Provider Notes (Signed)
[facility] EMERGENCY DEPARTMENT Provider Note   CSN: [id number] Arrival date & time: 08/02/20  0757     History Chief Complaint  Patient presents with  . Neck Pain    Ariaunna Hughson is a 35 y.o. female.  35 y.o female with a PMH of Lumbar herniated disc, presents to the ED with a chief complaint of neck pain x yesterday. She reports lying in bed when suddenly trying to stand up and felt a popping sensation to the right side of her neck.  He describes this pain as shooting, constant, exacerbated with rotation of the neck to the left side.  She reports pain in active spasms to her upper cervical spine.  Has taken a Flexeril about an hour ago, along with some heat without improvement.  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  She is followed for this history at the [place].  There is no fevers, changes in vision, weakness to the upper extremities or lower extremities.  No injuries.    The history is provided by the patient and medical records.  Neck Pain Pain location:  R side Quality:  Shooting Pain severity:  Moderate Pain is:  Same all the time Onset quality:  Sudden Duration:  1 day Timing:  Constant Progression:  Worsening Chronicity:  New Context: not fall, not lifting a heavy object and not MVC   Relieved by:  Nothing Worsened by:  Twisting Ineffective treatments:  Heat and muscle relaxants Associated symptoms: no chest pain, no fever, no headaches, no numbness, no photophobia, no syncope, no tingling and no visual change        Past Medical History:  Diagnosis Date  . Lumbar herniated disc     There are no problems to display for this patient.   Past Surgical History:  Procedure Laterality Date  . ANKLE SURGERY       OB History   No obstetric history on file.     No family history on file.  Social History   Tobacco Use  . Smoking status: Never  Smoker  . Smokeless tobacco: Never Used  Substance Use Topics  . Alcohol use: Yes    Comment: occ  . Drug use: No    Home Medications Prior to Admission medications   Medication Sig Start Date End Date Taking? Authorizing Provider  cyclobenzaprine (FLEXERIL) 10 MG tablet Take 1 tablet (10 mg total) by mouth 3 (three) times daily as needed for up to 7 days for muscle spasms. 08/02/20 08/09/20 Yes Asja Frommer, Leonie Douglas, PA-C  predniSONE (DELTASONE) 20 MG tablet Take 2 tablets (40 mg total) by mouth daily for 5 days. 08/02/20 08/07/20 Yes Anivea Velasques, PA-C  fluticasone (FLONASE) 50 MCG/ACT nasal spray Place 1 spray into both nostrils daily. 05/18/16   Audry Pili, PA-C  HYDROcodone-acetaminophen (NORCO/VICODIN) 5-325 MG tablet Take 1 tablet by mouth every 6 (six) hours as needed for severe pain. 03/07/18   Robinson, [place] N, PA-C  ibuprofen (ADVIL,MOTRIN) 800 MG tablet Take 1 tablet (800 mg total) by mouth 3 (three) times daily. 02/09/16   Rise Mu, PA-C  traMADol (ULTRAM) 50 MG tablet Take 1 tablet (50 mg total) by mouth every 6 (six) hours as needed. 10/02/16   Liberty Handy, PA-C    Allergies    Patient has no known allergies.  Review of Systems   Review of Systems  Constitutional: Negative for fever.  Eyes:  Negative for photophobia.  Respiratory: Negative for shortness of breath.   Cardiovascular: Negative for chest pain and syncope.  Musculoskeletal: Positive for myalgias and neck pain.  Neurological: Negative for tingling, numbness and headaches.    Physical Exam Updated Vital Signs BP 127/89 (BP Location: Left Arm)   Pulse 77   Temp 98.1 F (36.7 C)   Resp 16   LMP 06/26/2020   SpO2 99%   Physical Exam Vitals and nursing note reviewed.  Constitutional:      Appearance: Normal appearance.  HENT:     Head: Normocephalic and atraumatic.     Nose: Nose normal.  Eyes:     Pupils: Pupils are equal, round, and reactive to light.  Neck:     Trachea: Trachea normal.      Meningeal: Brudzinski's sign and Kernig's sign absent.  Pulmonary:     Effort: Pulmonary effort is normal.  Abdominal:     General: Abdomen is flat.  Musculoskeletal:     Cervical back: Tenderness present. Pain with movement and muscular tenderness present. No spinous process tenderness. Decreased range of motion.  Lymphadenopathy:     Cervical: No cervical adenopathy.  Skin:    General: Skin is warm and dry.  Neurological:     Mental Status: She is alert and oriented to person, place, and time.     Comments: Alert, oriented, thought content appropriate. Speech fluent without evidence of aphasia. Able to follow 2 step commands without difficulty.  Cranial Nerves:  II:  Peripheral visual fields grossly normal, pupils, round, reactive to light III,IV, VI: ptosis not present, extra-ocular motions intact bilaterally  V,VII: smile symmetric, facial light touch sensation equal VIII: hearing grossly normal bilaterally  IX,X: midline uvula rise  XI: bilateral shoulder shrug equal and strong with pain on right side XII: midline tongue extension  Motor:  5/5 in upper and lower extremities bilaterally including strong and equal grip strength and dorsiflexion/plantar flexion Sensory: light touch normal in all extremities.  Cerebellar: normal finger-to-nose with bilateral upper extremities, pronator drift negative Gait: normal gait and balance      ED Results / Procedures / Treatments   Labs (all labs ordered are listed, but only abnormal results are displayed) Labs Reviewed - No data to display  EKG None  Radiology No results found.  Procedures Procedures   Medications Ordered in ED Medications  diazepam (VALIUM) tablet 5 mg (5 mg Oral Given 08/02/20 0857)  ketorolac (TORADOL) 15 MG/ML injection 15 mg (15 mg Intramuscular Given 08/02/20 0858)  acetaminophen (TYLENOL) tablet 1,000 mg (1,000 mg Oral Given 08/02/20 0857)  oxyCODONE (Oxy IR/ROXICODONE) immediate release tablet 5  mg (5 mg Oral Given 08/02/20 5027)    ED Course  I have reviewed the triage vital signs and the nursing notes.  Pertinent labs & imaging results that were available during my care of the patient were reviewed by me and considered in my medical decision making (see chart for details).    MDM Rules/Calculators/A&P  Patient with a longstanding history of chronic back pain presents to the ED today with sudden onset of neck pain since yesterday.  Recent MRI which showed osteoarthritis of the neck, and narrowing of her spine.  States pain is shooting from her back worse with movement of her neck.  No red flags such as fever, neck stiffness, changes in vision,or gait imbalance.  During evaluation patient is teary-eyed on my exam, reports pain along the right side of her neck, pupils are equal and reactive.  Strength is adequate to bilateral upper extremities.  Neurologically intact.  Recent imaging of her neck without any acute finding.  We discussed symptomatic treatment at this time.  She is followed for her back pain by the [place].  Medication given to help with pain.  Vitals are within normal limits.  I have a low suspicion for infection at this time, visible spasms noted to the right side of her neck suspect likely neck strain.   10:27 AM patient reevaluated by me after an hour receiving the medication, she reports improvement in her symptoms.  We discussed close follow-up with PCP.  Going home with muscle relaxers along with steroids.  She is agreeable at this time, return precautions provided at length.   Portions of this note were generated with Scientist, clinical (histocompatibility and immunogenetics). Dictation errors may occur despite best attempts at proofreading.  Final Clinical Impression(s) / ED Diagnoses Final diagnoses:  Strain of neck muscle, initial encounter    Rx / DC Orders ED Discharge Orders         Ordered    cyclobenzaprine (FLEXERIL) 10 MG tablet  3 times daily PRN        08/02/20 0900    predniSONE  (DELTASONE) 20 MG tablet  Daily        08/02/20 0900           Claude Manges, PA-C 08/02/20 1027    Margarita Grizzle, MD 08/02/20 1515

## 2020-08-02 NOTE — ED Triage Notes (Signed)
C/o neck pain since yesterday.  States she felt a pop in her neck while lying in bed yesterday.  Used ice and heat without improvement.

## 2020-11-26 NOTE — Telephone Encounter (Signed)
Opened in error

## 2021-07-15 ENCOUNTER — Emergency Department (HOSPITAL_COMMUNITY)
Admission: EM | Admit: 2021-07-15 | Discharge: 2021-07-15 | Disposition: A | Discharge status: Home / Self Care | Payer: No Typology Code available for payment source | Attending: Emergency Medicine | Admitting: Emergency Medicine

## 2021-07-15 ENCOUNTER — Encounter (HOSPITAL_COMMUNITY): Payer: Self-pay

## 2021-07-15 ENCOUNTER — Other Ambulatory Visit: Payer: Self-pay

## 2021-07-15 DIAGNOSIS — G8929 Other chronic pain: Secondary | ICD-10-CM | POA: Insufficient documentation

## 2021-07-15 DIAGNOSIS — M5441 Lumbago with sciatica, right side: Secondary | ICD-10-CM | POA: Diagnosis not present

## 2021-07-15 DIAGNOSIS — M549 Dorsalgia, unspecified: Secondary | ICD-10-CM | POA: Diagnosis present

## 2021-07-15 MED ORDER — KETOROLAC TROMETHAMINE 30 MG/ML IJ SOLN
30.0000 mg | Freq: Once | INTRAMUSCULAR | Status: AC
  Administered 2021-07-15: 30 mg via INTRAMUSCULAR
  Filled 2021-07-15: qty 1

## 2021-07-15 MED ORDER — IBUPROFEN 800 MG PO TABS: 800.0000 mg | ORAL_TABLET | Freq: Three times a day (TID) | ORAL | 0 refills | Status: AC | PRN

## 2021-07-15 MED ORDER — DEXAMETHASONE SODIUM PHOSPHATE 10 MG/ML IJ SOLN
10.0000 mg | Freq: Once | INTRAMUSCULAR | Status: AC
  Administered 2021-07-15: 10 mg via INTRAMUSCULAR
  Filled 2021-07-15: qty 1

## 2021-07-15 NOTE — ED Triage Notes (Addendum)
Pt arrived POV from home c/o back pain that started about 2 days ago. Pt states she has had issues before with her back going out and bulging disk and has had to get a shot. Well pt went to pick up her godson 2 days ago and felt it go out and has been in pain since.

## 2021-07-15 NOTE — Discharge Instructions (Addendum)
You were seen today for evaluation of your chronic back pain.  [facility] and some anti-inflammatories, but unfortunately it seems like they do not do that at their facility anymore.  We are able to give you a dose of steroids while you are here in the ED as well as a shot of Toradol which acts like ibuprofen.  I sent you in a prescription for the prescription strength ibuprofen which you can take every 8 hours as needed for moderate to severe pain.  Please do not take more than this can cause GI damage or kidney damage.  Please follow-up with your primary care doctor at the [facility] and I hope you feel better soon.

## 2021-07-15 NOTE — ED Provider Triage Note (Signed)
Emergency Medicine Provider Triage Evaluation Note  Carolyn Farrell , a 36 y.o. female  was evaluated in triage.  Pt complains of back pain, similar to her previous episodes.  Has a history of lower back inflammation.  Reports that steroid shot helped her previously.  She called the [place] and they told her that they no longer do steroid shots and that she should come to the ER.  No difficulty ambulating, saddle anesthesia, bowel/bladder dysfunction.  Reports that her symptoms are the exact same of her previously known herniated disks  Review of Systems  As above  Physical Exam  BP 104/69 (BP Location: Right Arm)    Pulse 78    Temp 98.5 F (36.9 C) (Oral)    Resp 16    Ht 5\' 6"  (1.676 m)    Wt 93 kg    SpO2 100%    BMI 33.09 kg/m  Gen:   Awake, no distress   Resp:  Normal effort \ MSK:   Moves extremities without difficulty  Other:  Tenderness to palpation of paraspinals of the right thoracic and lumbar spine  Medical Decision Making  Medically screening exam initiated at 12:03 PM.  Appropriate orders placed.  Greer Rathe was informed that the remainder of the evaluation will be completed by another provider, this initial triage assessment does not replace that evaluation, and the importance of remaining in the ED until their evaluation is complete.     , PA-C 07/15/21 1205

## 2021-07-15 NOTE — ED Notes (Signed)
Discharge paperwork given to pt. Pt agreeable to discharge

## 2021-07-15 NOTE — ED Provider Notes (Signed)
[facility] EMERGENCY DEPARTMENT Provider Note   CSN: [id number] Arrival date & time: 07/15/21  1056     History  Chief Complaint  Patient presents with   Back Pain    Carolyn Farrell is a 36 y.o. female with history of lumbar herniated disc and chronic back pain managed at the [place] who presents to the ED today for evaluation of a flareup of her back pain.  Patient was at [facility] this last week and picked up her godson and this along with the wear and tear of being at the [place] and driving home severely exacerbated her lower back pain.  [facility], however they said that no longer do this procedure at the [place] and referred her to the ED instead.  She states that she does get a cortisone shot, but usually she will get an IM steroid injection which significantly improves her pain.  Patient does not wish to have further imaging and states that this flareup feels exactly like her previous flareups.  She denies new numbness, tingling, weakness, saddle paresthesia, bladder and bowel incontinence.   Back Pain Associated symptoms: no abdominal pain, no fever and no headaches       Home Medications Prior to Admission medications   Medication Sig Start Date End Date Taking? Authorizing Provider  ibuprofen (ADVIL) 800 MG tablet Take 1 tablet (800 mg total) by mouth every 8 (eight) hours as needed for moderate pain. 07/15/21  Yes Raynald Blend R, PA-C  fluticasone (FLONASE) 50 MCG/ACT nasal spray Place 1 spray into both nostrils daily. 05/18/16   Audry Pili, PA-C  HYDROcodone-acetaminophen (NORCO/VICODIN) 5-325 MG tablet Take 1 tablet by mouth every 6 (six) hours as needed for severe pain. 03/07/18   Robinson, [place] N, PA-C  ibuprofen (ADVIL,MOTRIN) 800 MG tablet Take 1 tablet (800 mg total) by mouth 3 (three) times daily. 02/09/16   Rise Mu, PA-C  traMADol (ULTRAM) 50 MG tablet Take 1 tablet (50 mg total) by  mouth every 6 (six) hours as needed. 10/02/16   Liberty Handy, PA-C      Allergies    Patient has no known allergies.    Review of Systems   Review of Systems  Constitutional:  Negative for fever.  HENT: Negative.    Eyes: Negative.   Respiratory:  Negative for shortness of breath.   Cardiovascular: Negative.   Gastrointestinal:  Negative for abdominal pain and vomiting.  Endocrine: Negative.   Genitourinary: Negative.   Musculoskeletal:  Positive for back pain.  Skin:  Negative for rash.  Neurological:  Negative for headaches.  All other systems reviewed and are negative.  Physical Exam Updated Vital Signs BP 116/78 (BP Location: Right Arm)    Pulse 74    Temp 98 F (36.7 C) (Oral)    Resp 15    Ht 5\' 6"  (1.676 m)    Wt 93 kg    SpO2 100%    BMI 33.09 kg/m  Physical Exam Vitals and nursing note reviewed.  Constitutional:      General: She is not in acute distress.    Appearance: She is not ill-appearing.  HENT:     Head: Atraumatic.  Eyes:     Conjunctiva/sclera: Conjunctivae normal.  Cardiovascular:     Rate and Rhythm: Normal rate and regular rhythm.     Pulses: Normal pulses.     Heart sounds: No murmur heard. Pulmonary:  Effort: Pulmonary effort is normal. No respiratory distress.     Breath sounds: Normal breath sounds.  Abdominal:     General: Abdomen is flat. There is no distension.     Palpations: Abdomen is soft.     Tenderness: There is no abdominal tenderness.  Musculoskeletal:        General: Normal range of motion.     Cervical back: Normal range of motion.     Comments: ROM of torso limited due to pain.  Patient is able to ambulate and is more comfortable standing than sitting.  Skin:    General: Skin is warm and dry.     Capillary Refill: Capillary refill takes less than 2 seconds.  Neurological:     General: No focal deficit present.     Mental Status: She is alert.     Comments: Speech is clear, able to follow commands CN III-XII  intact Normal strength in upper and lower extremities bilaterally including dorsiflexion and plantar flexion, strong and equal grip strength Sensation normal to light and sharp touch Moves extremities without ataxia, coordination intact Normal finger to nose and rapid alternating movements No pronator drift    Psychiatric:        Mood and Affect: Mood normal.    ED Results / Procedures / Treatments   Labs (all labs ordered are listed, but only abnormal results are displayed) Labs Reviewed - No data to display  EKG None  Radiology No results found.  Procedures Procedures    Medications Ordered in ED Medications  dexamethasone (DECADRON) injection 10 mg (10 mg Intramuscular Given 07/15/21 1413)  ketorolac (TORADOL) 30 MG/ML injection 30 mg (30 mg Intramuscular Given 07/15/21 1413)    ED Course/ Medical Decision Making/ A&P                           Medical Decision Making Risk Prescription drug management.   History:  Carolyn Farrell is a 36 y.o. female with history of lumbar herniated disc and chronic back pain managed [facility] [place] who presents to the ED today for evaluation of a flareup of her back pain.  [facility] and picked up her godson and this along with the wear and tear of being at the [facility] and driving home severely exacerbated her lower back pain.  [facility], however they said that no longer do this [facility] [place] and referred her to the ED instead.  She states that she does get a cortisone shot, but usually she will get an IM steroid injection which significantly improves her pain.  Patient does not wish to have further imaging and states that this flareup feels exactly like her previous flareups.  She denies new numbness, tingling, weakness, saddle paresthesia, bladder and bowel incontinence. This patient presents to the ED for concern of lower back pain, this involves an  extensive number of treatment options, and is a complaint that carries with it a high risk of complications and morbidity.   Emergent considerations in the differential diagnosis of back pain include:occult fracture, congenital anomalies, tumors, vascular catastrophes, osteomyelitis of vertebrae, infections of disc, meninges or cord, space occupying lesions within canal leading to cord or root, compression including epidural abscess.   Medicines ordered and prescription drug management:  I ordered medication including: Decadron 10 mg and Toradol 30 mg for chronic back pain  Reevaluation of the patient after these medicines showed that the patient improved I have reviewed the patients home medicines and have made adjustments as needed    Disposition:  After consideration of the diagnostic results, physical exam, history and the patients response to treatment feel that the patent would benefit from discharge with strict return precautions.   Chronic lower back pain: Patient sitting in chair in hallway obvious discomfort.  I asked her to stand up that was more comfortable for her and she agreed.  She is ambulating without difficulty.  Range of motion of the torso difficult due to pain.  No focal deficits.  +2 DP pulses bilaterally.  Patient insists that this is a well-known flareup for her that is [facility] with [facility].  She is not requesting narcotic pain medication.  She wishes to have her steroid and NSAIDs and leave.  Given that she is well experienced with this issue and she has no new trauma, I think this is a reasonable option.  Return precautions discussed.  I refilled her prescription for ibuprofen 800 mg.  She had all questions asked and answered.  [facility].  Discharged home in good condition.   Final Clinical Impression(s) / ED Diagnoses Final diagnoses:  Chronic midline low back pain with right-sided sciatica    Rx / DC  Orders ED Discharge Orders          Ordered    ibuprofen (ADVIL) 800 MG tablet  Every 8 hours PRN        07/15/21 1417              Janell Quiet, PA-C 07/15/21 1512    Gwyneth Sprout, MD 07/15/21 1528
